# Patient Record
Sex: Female | Born: 1970
Health system: Southern US, Community
[De-identification: ages and names within clinical notes are randomized; demographics above are authoritative.]

---

## 2017-08-08 ENCOUNTER — Other Ambulatory Visit: Payer: Self-pay | Admitting: Family Medicine

## 2017-08-08 ENCOUNTER — Other Ambulatory Visit (HOSPITAL_COMMUNITY)
Admission: RE | Admit: 2017-08-08 | Discharge: 2017-08-08 | Disposition: A | Discharge status: Home / Self Care | Payer: BLUE CROSS/BLUE SHIELD | Source: Ambulatory Visit | Attending: Family Medicine | Admitting: Family Medicine

## 2017-08-08 DIAGNOSIS — Z124 Encounter for screening for malignant neoplasm of cervix: Secondary | ICD-10-CM | POA: Diagnosis not present

## 2017-08-08 DIAGNOSIS — Z23 Encounter for immunization: Secondary | ICD-10-CM | POA: Diagnosis not present

## 2017-08-08 DIAGNOSIS — Z13220 Encounter for screening for lipoid disorders: Secondary | ICD-10-CM | POA: Diagnosis not present

## 2017-08-08 DIAGNOSIS — Z113 Encounter for screening for infections with a predominantly sexual mode of transmission: Secondary | ICD-10-CM | POA: Diagnosis not present

## 2017-08-08 DIAGNOSIS — Z1231 Encounter for screening mammogram for malignant neoplasm of breast: Secondary | ICD-10-CM

## 2017-08-08 DIAGNOSIS — Z0000 Encounter for general adult medical examination without abnormal findings: Secondary | ICD-10-CM | POA: Diagnosis not present

## 2017-08-08 DIAGNOSIS — Z131 Encounter for screening for diabetes mellitus: Secondary | ICD-10-CM | POA: Diagnosis not present

## 2017-08-10 LAB — CYTOLOGY - PAP
Chlamydia: NEGATIVE
DIAGNOSIS: NEGATIVE
HPV (WINDOPATH): NOT DETECTED
Neisseria Gonorrhea: NEGATIVE

## 2017-09-09 ENCOUNTER — Ambulatory Visit: Payer: Self-pay

## 2017-09-30 ENCOUNTER — Ambulatory Visit: Payer: BLUE CROSS/BLUE SHIELD

## 2017-10-14 ENCOUNTER — Ambulatory Visit
Admission: RE | Admit: 2017-10-14 | Discharge: 2017-10-14 | Disposition: A | Discharge status: Home / Self Care | Payer: BLUE CROSS/BLUE SHIELD | Source: Ambulatory Visit | Attending: Family Medicine | Admitting: Family Medicine

## 2017-10-14 DIAGNOSIS — Z1231 Encounter for screening mammogram for malignant neoplasm of breast: Secondary | ICD-10-CM | POA: Diagnosis not present

## 2017-10-18 ENCOUNTER — Other Ambulatory Visit: Payer: Self-pay | Admitting: Family Medicine

## 2017-10-18 DIAGNOSIS — R928 Other abnormal and inconclusive findings on diagnostic imaging of breast: Secondary | ICD-10-CM

## 2017-10-21 ENCOUNTER — Encounter (INDEPENDENT_AMBULATORY_CARE_PROVIDER_SITE_OTHER): Payer: Self-pay

## 2017-10-21 ENCOUNTER — Other Ambulatory Visit: Payer: Self-pay | Admitting: Family Medicine

## 2017-10-21 ENCOUNTER — Ambulatory Visit
Admission: RE | Admit: 2017-10-21 | Discharge: 2017-10-21 | Disposition: A | Discharge status: Home / Self Care | Payer: BLUE CROSS/BLUE SHIELD | Source: Ambulatory Visit | Attending: Family Medicine | Admitting: Family Medicine

## 2017-10-21 DIAGNOSIS — R928 Other abnormal and inconclusive findings on diagnostic imaging of breast: Secondary | ICD-10-CM

## 2017-10-21 DIAGNOSIS — N6489 Other specified disorders of breast: Secondary | ICD-10-CM

## 2017-10-25 ENCOUNTER — Other Ambulatory Visit: Payer: Self-pay | Admitting: Family Medicine

## 2018-05-01 ENCOUNTER — Ambulatory Visit
Admission: RE | Admit: 2018-05-01 | Discharge: 2018-05-01 | Disposition: A | Discharge status: Home / Self Care | Payer: BLUE CROSS/BLUE SHIELD | Source: Ambulatory Visit | Attending: Family Medicine | Admitting: Family Medicine

## 2018-05-01 ENCOUNTER — Ambulatory Visit: Payer: BLUE CROSS/BLUE SHIELD

## 2018-05-01 DIAGNOSIS — N6489 Other specified disorders of breast: Secondary | ICD-10-CM

## 2018-05-01 DIAGNOSIS — R918 Other nonspecific abnormal finding of lung field: Secondary | ICD-10-CM | POA: Diagnosis not present

## 2018-11-16 ENCOUNTER — Other Ambulatory Visit: Payer: Self-pay | Admitting: Family Medicine

## 2018-11-16 DIAGNOSIS — Z1231 Encounter for screening mammogram for malignant neoplasm of breast: Secondary | ICD-10-CM

## 2018-12-29 ENCOUNTER — Other Ambulatory Visit: Payer: Self-pay

## 2018-12-29 ENCOUNTER — Ambulatory Visit
Admission: RE | Admit: 2018-12-29 | Discharge: 2018-12-29 | Disposition: A | Discharge status: Home / Self Care | Payer: BC Managed Care – PPO | Source: Ambulatory Visit | Attending: Family Medicine | Admitting: Family Medicine

## 2018-12-29 DIAGNOSIS — Z1231 Encounter for screening mammogram for malignant neoplasm of breast: Secondary | ICD-10-CM

## 2019-03-18 IMAGING — US ULTRASOUND RIGHT BREAST LIMITED
1 series · 4 of 4 positions shown · non-contrast
Comparison: Previous exam(s).

CLINICAL DATA: Patient recalled from screening for right breast
asymmetry.

EXAM:
DIGITAL DIAGNOSTIC RIGHT MAMMOGRAM WITH CAD AND TOMO
ULTRASOUND RIGHT BREAST

[Series 1: ultrasound right breast limited · 0.06mm/px · 4 of 4 slices shown]
[im 1/4]
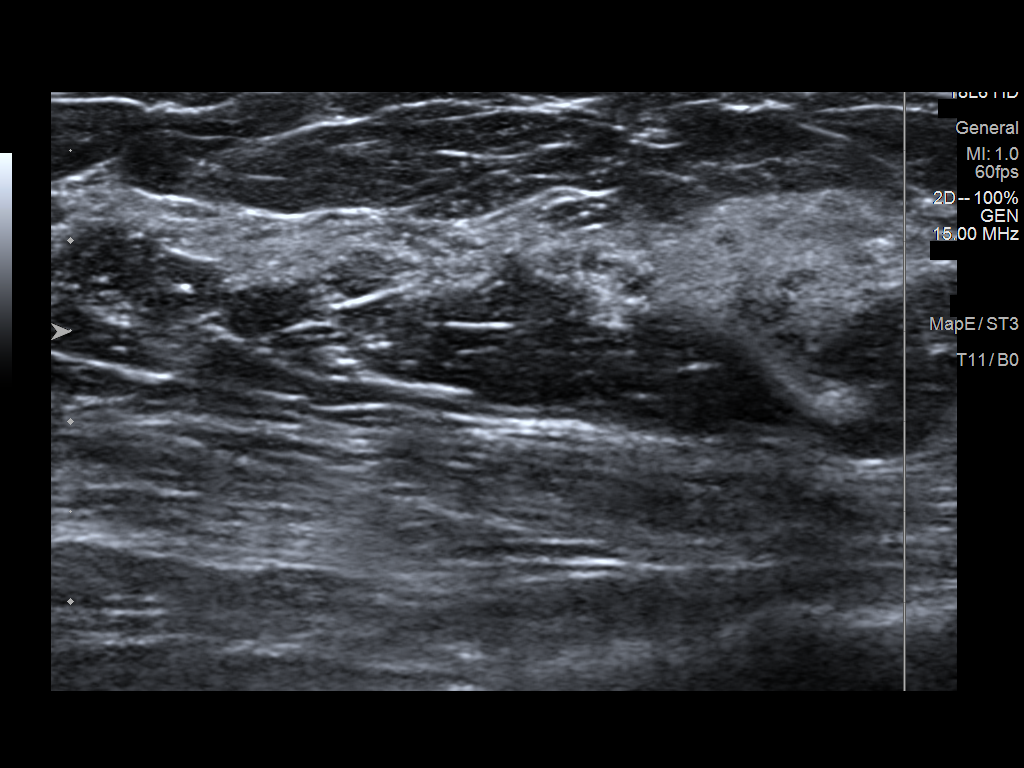
[im 2/4]
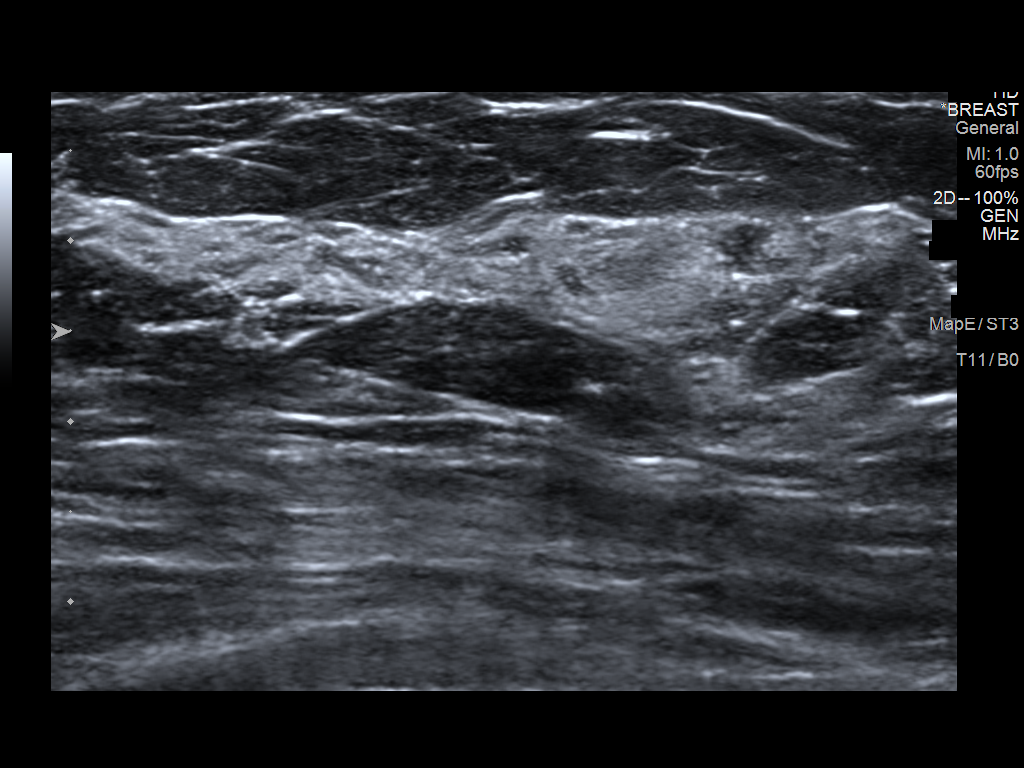
[im 3/4]
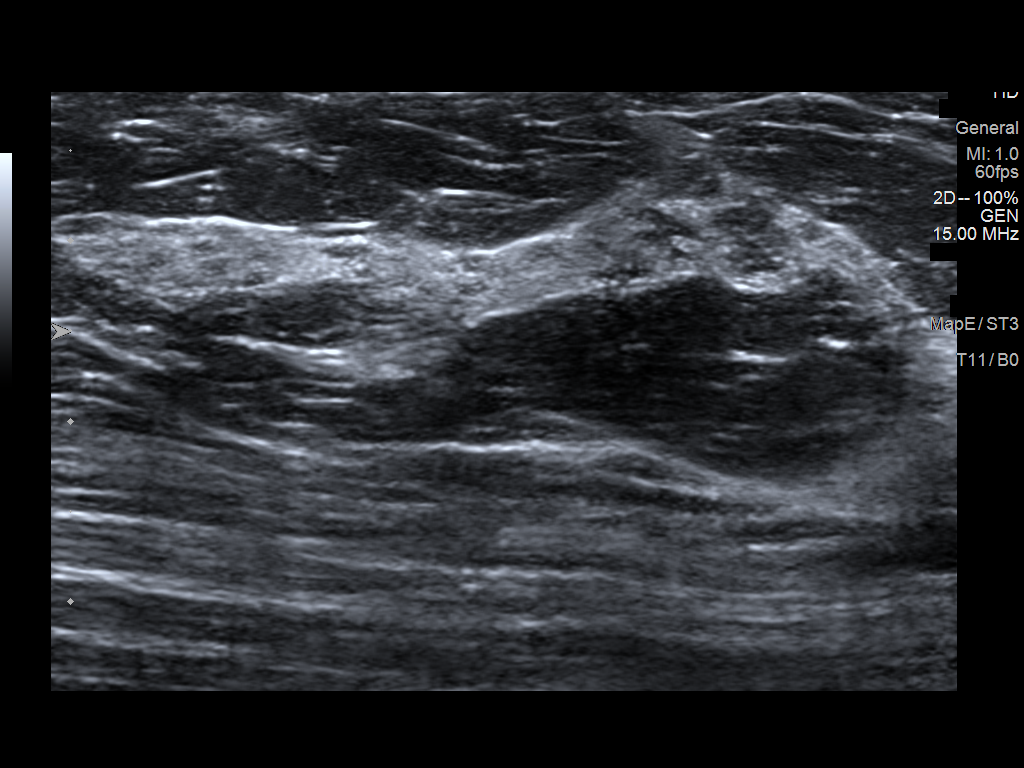
[im 4/4]
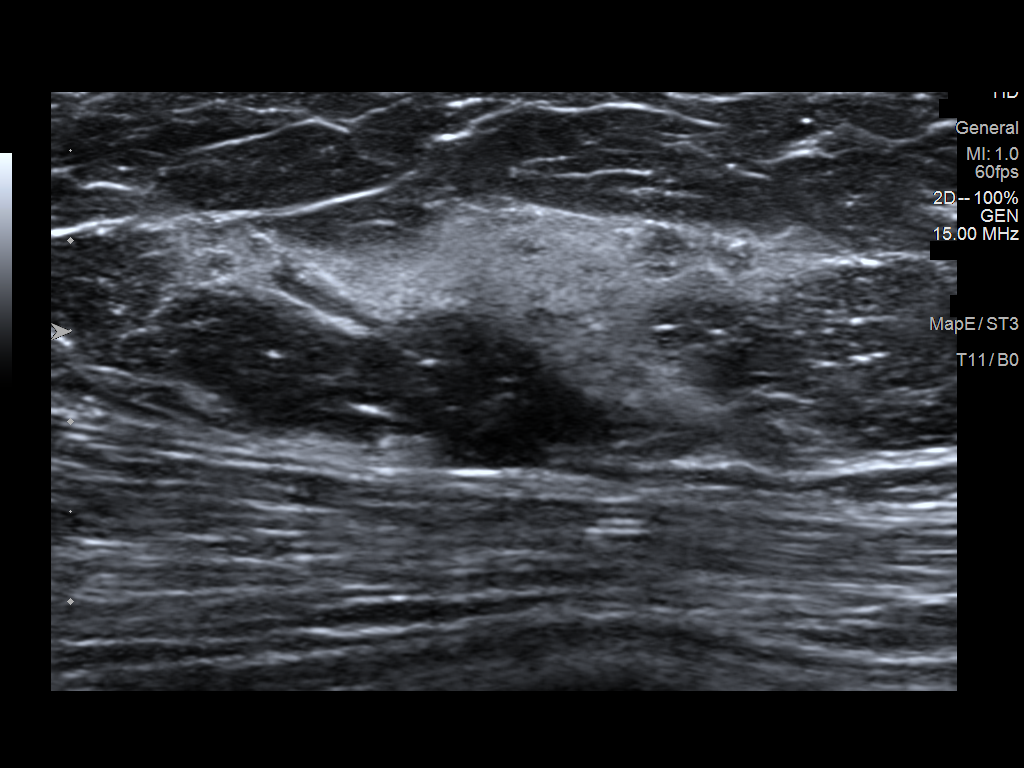

[4 of 4 positions shown; findings below may reference images not displayed]

ACR Breast Density Category b: There are scattered areas of
fibroglandular density.
FINDINGS: Spot compression CC and MLO tomosynthesis images of the right breast
were obtained. Questioned asymmetry within the posterosuperior right
breast predominately effaced with additional imaging, suggestive of
dense fibroglandular tissue.

Mammographic images were processed with CAD.

On physical exam, I palpate no discrete mass within the right breast
11 o'clock position.

Targeted ultrasound is performed, showing normal dense tissue
without suspicious mass right breast 11 o'clock position 7 cm from
the nipple.
IMPRESSION: Right breast asymmetry felt to correspond with dense fibroglandular
tissue.

RECOMMENDATION:
Right breast diagnostic mammogram and possible ultrasound in 6
months.

I have discussed the findings and recommendations with the patient.
Results were also provided in writing at the conclusion of the
visit. If applicable, a reminder letter will be sent to the patient
regarding the next appointment.

BI-RADS CATEGORY  3: Probably benign.

## 2019-11-15 ENCOUNTER — Other Ambulatory Visit: Payer: Self-pay | Admitting: Family Medicine

## 2019-11-15 DIAGNOSIS — Z1231 Encounter for screening mammogram for malignant neoplasm of breast: Secondary | ICD-10-CM

## 2020-01-04 ENCOUNTER — Ambulatory Visit: Payer: BC Managed Care – PPO

## 2020-01-18 ENCOUNTER — Ambulatory Visit
Admission: RE | Admit: 2020-01-18 | Discharge: 2020-01-18 | Disposition: A | Discharge status: Home / Self Care | Payer: BC Managed Care – PPO | Source: Ambulatory Visit | Attending: Family Medicine | Admitting: Family Medicine

## 2020-01-18 ENCOUNTER — Other Ambulatory Visit: Payer: Self-pay

## 2020-01-18 DIAGNOSIS — Z1231 Encounter for screening mammogram for malignant neoplasm of breast: Secondary | ICD-10-CM

## 2020-02-20 ENCOUNTER — Ambulatory Visit: Payer: BC Managed Care – PPO

## 2020-05-25 IMAGING — MG DIGITAL SCREENING BILATERAL MAMMOGRAM WITH TOMO AND CAD
8 series · 8 of 24 positions shown · non-contrast
Comparison: Previous exam(s).

CLINICAL DATA: Screening.

EXAM:
DIGITAL SCREENING BILATERAL MAMMOGRAM WITH TOMO AND CAD

[R CC synth-2D]
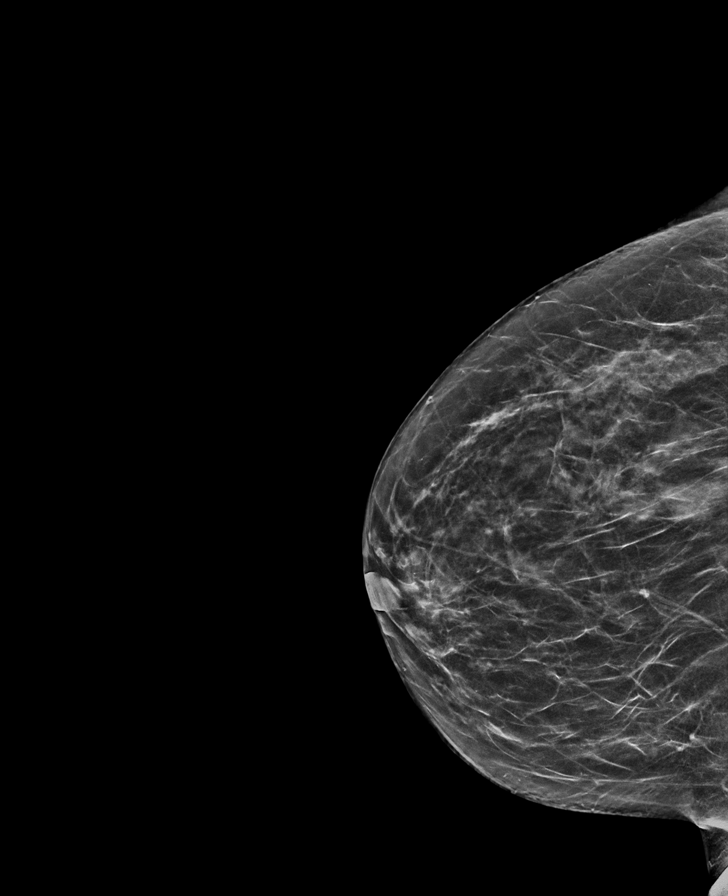

[R MLO synth-2D]
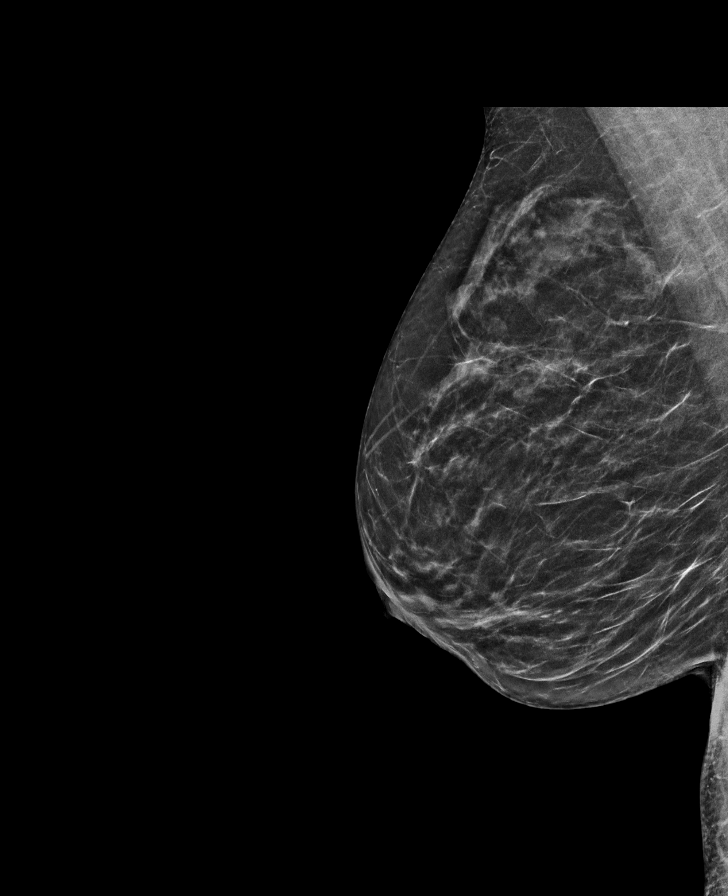

[L CC synth-2D]
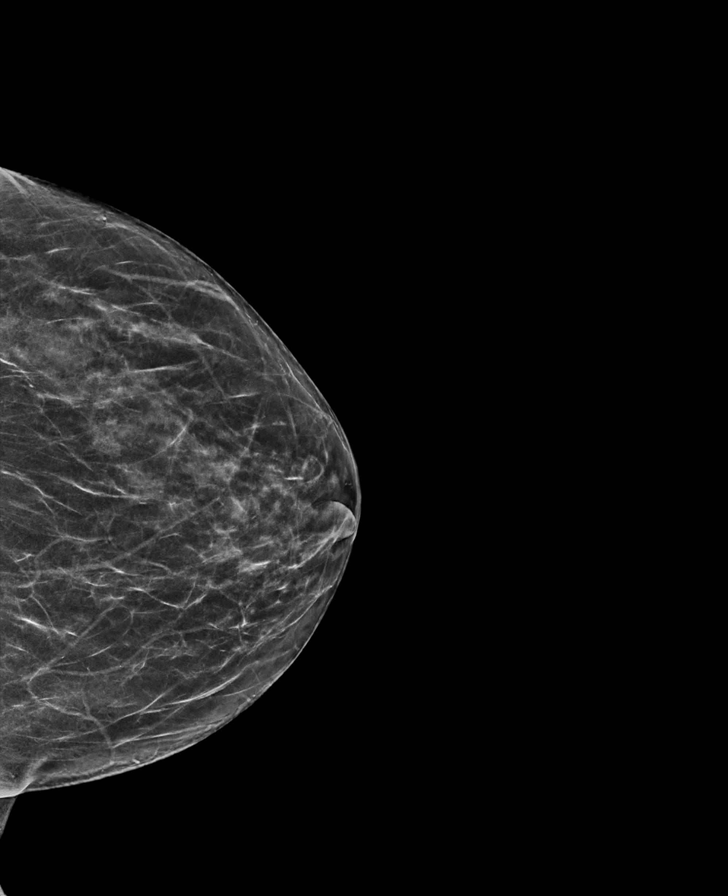

[L MLO synth-2D]
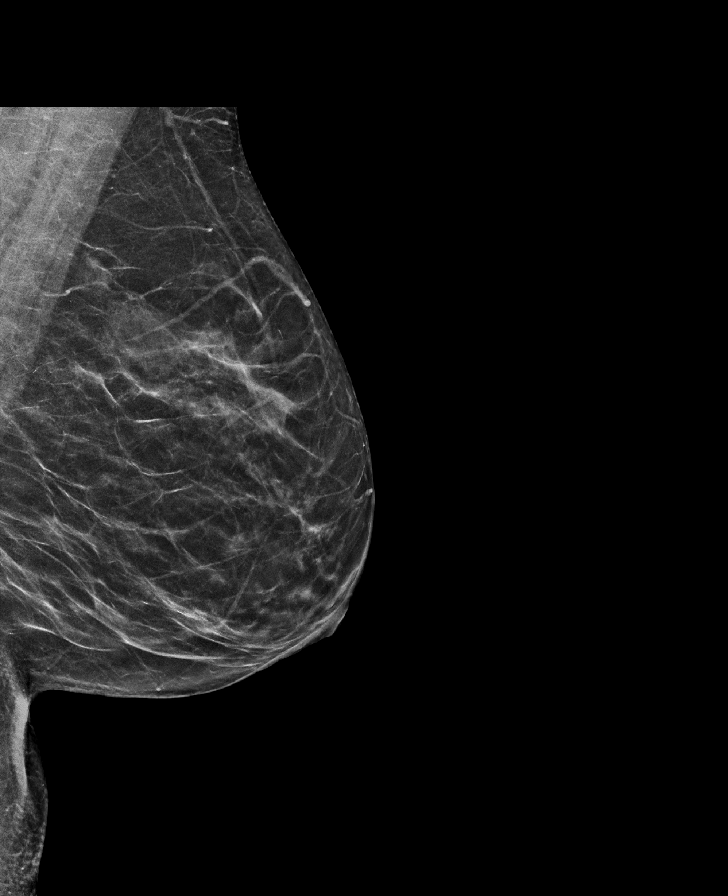

[L MLO tomo · tomo slice 30/59.0]
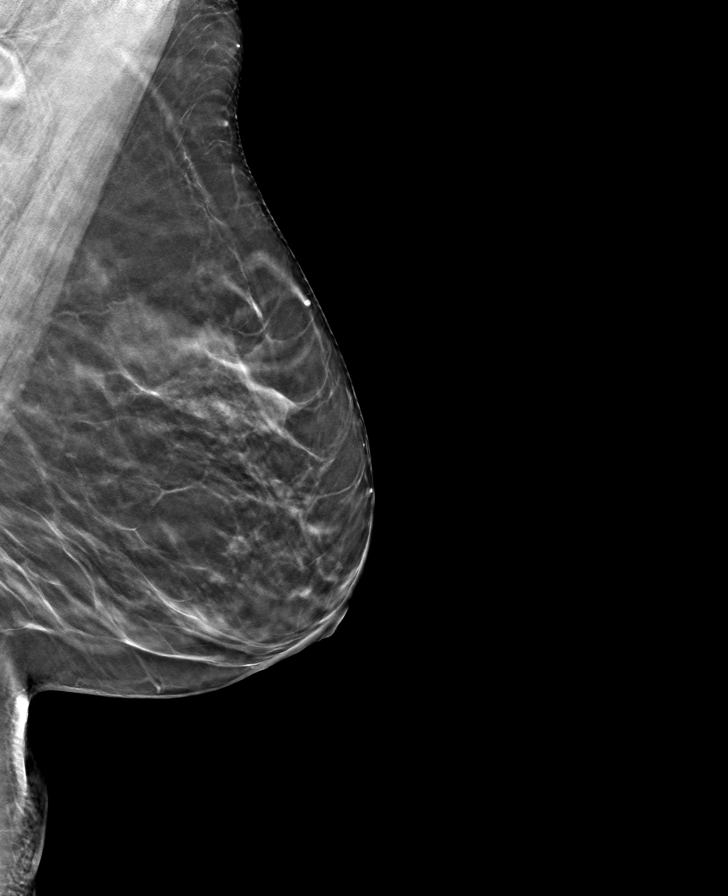

[L CC tomo · tomo slice 28/55.0]
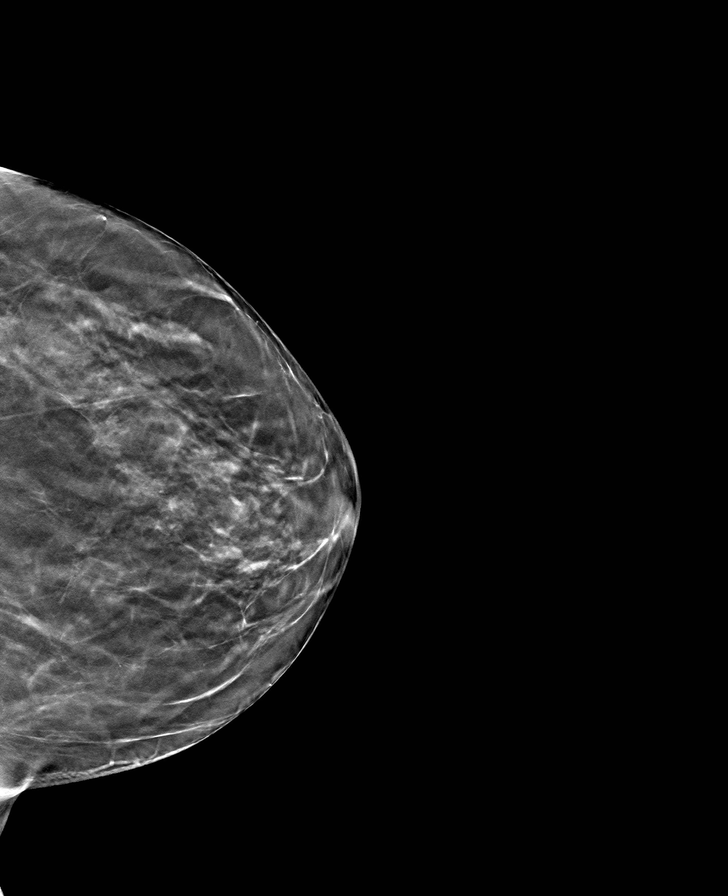

[R MLO tomo · tomo slice 30/59.0]
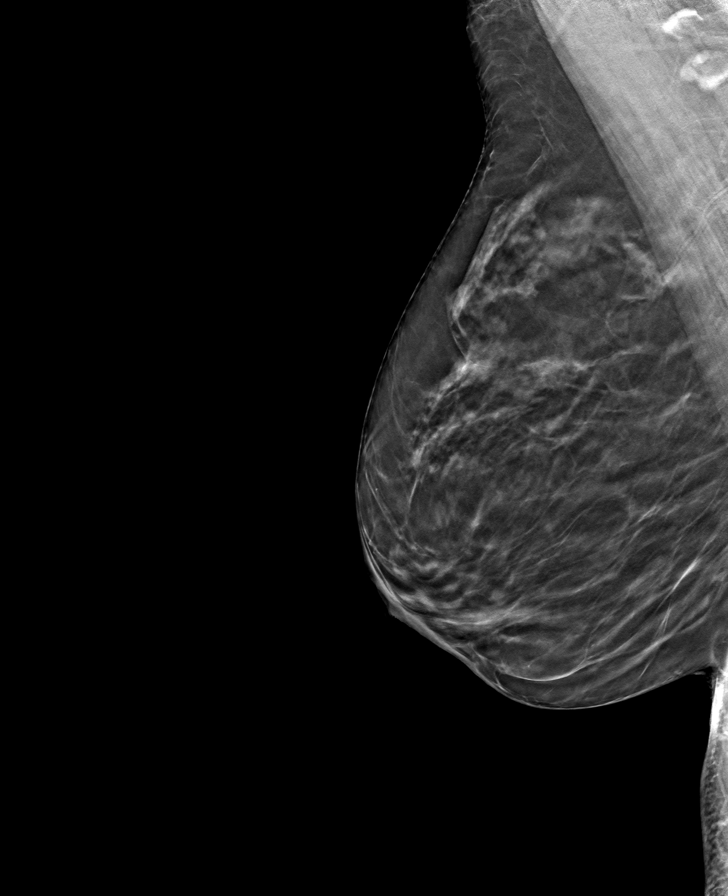

[R CC tomo · tomo slice 29/58.0]
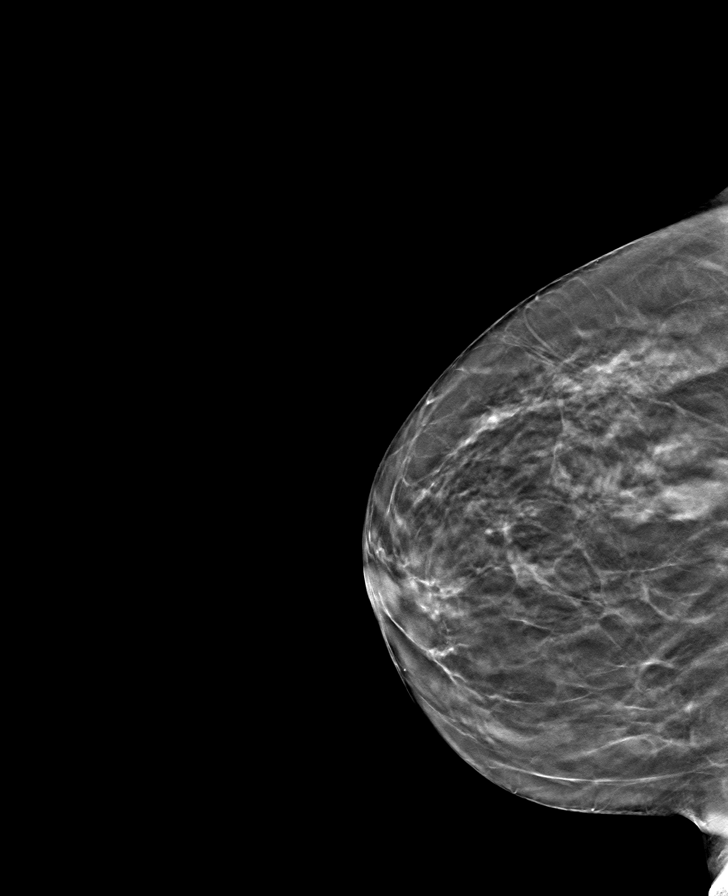

[8 of 24 positions shown; findings below may reference images not displayed]

ACR Breast Density Category c: The breast tissue is heterogeneously
dense, which may obscure small masses.
FINDINGS: There are no findings suspicious for malignancy. Images were
processed with CAD.
IMPRESSION: No mammographic evidence of malignancy. A result letter of this
screening mammogram will be mailed directly to the patient.

RECOMMENDATION:
Screening mammogram in one year. (Code:FT-U-LHB)

BI-RADS CATEGORY  1: Negative.

## 2020-08-19 DIAGNOSIS — Z202 Contact with and (suspected) exposure to infections with a predominantly sexual mode of transmission: Secondary | ICD-10-CM | POA: Diagnosis not present

## 2020-08-24 DIAGNOSIS — Z202 Contact with and (suspected) exposure to infections with a predominantly sexual mode of transmission: Secondary | ICD-10-CM | POA: Diagnosis not present

## 2020-08-25 DIAGNOSIS — Z202 Contact with and (suspected) exposure to infections with a predominantly sexual mode of transmission: Secondary | ICD-10-CM | POA: Diagnosis not present

## 2020-10-12 DIAGNOSIS — Z202 Contact with and (suspected) exposure to infections with a predominantly sexual mode of transmission: Secondary | ICD-10-CM | POA: Diagnosis not present

## 2020-11-27 DIAGNOSIS — Z20822 Contact with and (suspected) exposure to covid-19: Secondary | ICD-10-CM | POA: Diagnosis not present

## 2020-11-28 DIAGNOSIS — Z20822 Contact with and (suspected) exposure to covid-19: Secondary | ICD-10-CM | POA: Diagnosis not present

## 2021-01-09 DIAGNOSIS — Z202 Contact with and (suspected) exposure to infections with a predominantly sexual mode of transmission: Secondary | ICD-10-CM | POA: Diagnosis not present

## 2021-04-03 ENCOUNTER — Other Ambulatory Visit (HOSPITAL_COMMUNITY)
Admission: RE | Admit: 2021-04-03 | Discharge: 2021-04-03 | Disposition: A | Discharge status: Home / Self Care | Payer: No Typology Code available for payment source | Source: Ambulatory Visit | Attending: Family Medicine | Admitting: Family Medicine

## 2021-04-03 ENCOUNTER — Other Ambulatory Visit: Payer: Self-pay

## 2021-04-03 ENCOUNTER — Emergency Department
Admission: EM | Admit: 2021-04-03 | Discharge: 2021-04-03 | Disposition: A | Discharge status: Home / Self Care | Payer: BC Managed Care – PPO | Source: Home / Self Care

## 2021-04-03 ENCOUNTER — Encounter: Payer: Self-pay | Admitting: Emergency Medicine

## 2021-04-03 DIAGNOSIS — Z202 Contact with and (suspected) exposure to infections with a predominantly sexual mode of transmission: Secondary | ICD-10-CM | POA: Insufficient documentation

## 2021-04-03 DIAGNOSIS — Z113 Encounter for screening for infections with a predominantly sexual mode of transmission: Secondary | ICD-10-CM | POA: Diagnosis present

## 2021-04-03 NOTE — Discharge Instructions (Addendum)
Advised/instructed patient we would follow-up with her once lab results return.

## 2021-04-03 NOTE — ED Triage Notes (Signed)
STD testing request  Denies any symptoms

## 2021-04-03 NOTE — ED Provider Notes (Signed)
[facility]    CSN: [id number] Arrival date & time: 04/03/21  1743      History   Chief Complaint Chief Complaint  Patient presents with   SEXUALLY TRANSMITTED DISEASE    HPI Amy Mccullough is a 50 y.o. female.   HPI 50 year old female presents with potential exposure to sexually transmitted disease.  Patient currently denies any symptoms.  History reviewed. No pertinent past medical history.  There are no problems to display for this patient.   History reviewed. No pertinent surgical history.  OB History   No obstetric history on file.      Home Medications    Prior to Admission medications   Not on File    Family History Family History  Problem Relation Age of Onset   Healthy Mother    Melanoma Father    Breast cancer Neg Hx     Social History Social History   Tobacco Use   Smoking status: Every Day    Packs/day: 0.20    Types: Cigarettes    Passive exposure: Never   Smokeless tobacco: Never  Vaping Use   Vaping Use: Never used  Substance Use Topics   Alcohol use: Yes    Alcohol/week: 2.0 standard drinks    Types: 2 Shots of liquor per week   Drug use: Never     Allergies   Patient has no known allergies.   Review of Systems Review of Systems  Genitourinary:        Request STD testing for possible exposure to STD.  No symptoms are reported at this time.  All other systems reviewed and are negative.   Physical Exam Triage Vital Signs ED Triage Vitals  Enc Vitals Group     BP 04/03/21 1832 118/80     Pulse Rate 04/03/21 1832 94     Resp 04/03/21 1832 16     Temp 04/03/21 1832 98.8 F (37.1 C)     Temp Source 04/03/21 1832 Oral     SpO2 04/03/21 1832 97 %     Weight 04/03/21 1839 158 lb (71.7 kg)     Height 04/03/21 1839 5\' 4"  (1.626 m)     Head Circumference --      Peak Flow --      Pain Score 04/03/21 1835 0     Pain Loc --      Pain Edu? --      Excl. in GC? --    No data found.  Updated Vital Signs BP  118/80 (BP Location: Left Arm)   Pulse 94   Temp 98.8 F (37.1 C) (Oral)   Resp 16   Ht 5\' 4"  (1.626 m)   Wt 158 lb (71.7 kg)   SpO2 97%   BMI 27.12 kg/m     Physical Exam Vitals and nursing note reviewed.  Constitutional:      General: She is not in acute distress.    Appearance: Normal appearance. She is normal weight. She is not ill-appearing.  HENT:     Head: Normocephalic and atraumatic.     Mouth/Throat:     Mouth: Mucous membranes are moist.     Pharynx: Oropharynx is clear.  Eyes:     Extraocular Movements: Extraocular movements intact.     Conjunctiva/sclera: Conjunctivae normal.     Pupils: Pupils are equal, round, and reactive to light.  Cardiovascular:     Rate and Rhythm: Normal rate and regular rhythm.     Pulses: Normal pulses.  Heart sounds: Normal heart sounds.  Pulmonary:     Effort: Pulmonary effort is normal.     Breath sounds: Normal breath sounds.  Musculoskeletal:        General: Normal range of motion.     Cervical back: Normal range of motion and neck supple.  Skin:    General: Skin is warm and dry.  Neurological:     General: No focal deficit present.     Mental Status: She is alert and oriented to person, place, and time. Mental status is at baseline.     UC Treatments / Results  Labs (all labs ordered are listed, but only abnormal results are displayed) Labs Reviewed  HIV ANTIBODY (ROUTINE TESTING W REFLEX)  RPR  HEPATITIS C ANTIBODY  CERVICOVAGINAL ANCILLARY ONLY    EKG   Radiology No results found.  Procedures Procedures (including critical care time)  Medications Ordered in UC Medications - No data to display  Initial Impression / Assessment and Plan / UC Course  I have reviewed the triage vital signs and the nursing notes.  Pertinent labs & imaging results that were available during my care of the patient were reviewed by me and considered in my medical decision making (see chart for details).     MDM: 1.   Potential exposure to STD-Aptima swab ordered; hep C, RPR, HIV reflexes.  Advised/instructed patient we would follow-up with her once lab results return.  Patient discharged home, hemodynamically stable. Final Clinical Impressions(s) / UC Diagnoses   Final diagnoses:  Potential exposure to STD     Discharge Instructions      Advised/instructed patient we would follow-up with her once lab results return.     ED Prescriptions   None    PDMP not reviewed this encounter.   Trevor Iha, FNP 04/03/21 1920

## 2021-04-06 LAB — HIV ANTIBODY (ROUTINE TESTING W REFLEX): HIV 1&2 Ab, 4th Generation: NONREACTIVE

## 2021-04-06 LAB — HEPATITIS C ANTIBODY
Hepatitis C Ab: NONREACTIVE
SIGNAL TO CUT-OFF: 0.06 (ref <1.00)

## 2021-04-06 LAB — RPR: RPR Ser Ql: NONREACTIVE

## 2021-04-07 LAB — CERVICOVAGINAL ANCILLARY ONLY
Chlamydia: NEGATIVE
Comment: NEGATIVE
Comment: NEGATIVE
Comment: NORMAL
Neisseria Gonorrhea: NEGATIVE
Trichomonas: NEGATIVE
